# Patient Record
Sex: Female | Born: 1935 | Race: Black or African American | Hispanic: No | Marital: Single | Smoking: Never smoker
Health system: Southern US, Community
[De-identification: ages and names within clinical notes are randomized; demographics above are authoritative.]

## PROBLEM LIST (undated history)

## (undated) DIAGNOSIS — I509 Heart failure, unspecified: Secondary | ICD-10-CM

## (undated) DIAGNOSIS — M1990 Unspecified osteoarthritis, unspecified site: Secondary | ICD-10-CM

---

## 2020-08-28 DIAGNOSIS — S0083XA Contusion of other part of head, initial encounter: Secondary | ICD-10-CM | POA: Diagnosis not present

## 2020-08-28 DIAGNOSIS — S0012XA Contusion of left eyelid and periocular area, initial encounter: Secondary | ICD-10-CM | POA: Insufficient documentation

## 2020-08-28 DIAGNOSIS — M542 Cervicalgia: Secondary | ICD-10-CM | POA: Insufficient documentation

## 2020-08-28 DIAGNOSIS — S0990XA Unspecified injury of head, initial encounter: Secondary | ICD-10-CM | POA: Diagnosis present

## 2020-08-28 DIAGNOSIS — I509 Heart failure, unspecified: Secondary | ICD-10-CM | POA: Insufficient documentation

## 2020-08-28 DIAGNOSIS — M79662 Pain in left lower leg: Secondary | ICD-10-CM | POA: Diagnosis not present

## 2020-08-28 DIAGNOSIS — R079 Chest pain, unspecified: Secondary | ICD-10-CM | POA: Insufficient documentation

## 2020-08-28 DIAGNOSIS — W101XXA Fall (on)(from) sidewalk curb, initial encounter: Secondary | ICD-10-CM | POA: Diagnosis not present

## 2020-08-28 DIAGNOSIS — Z20822 Contact with and (suspected) exposure to covid-19: Secondary | ICD-10-CM | POA: Diagnosis not present

## 2020-08-28 DIAGNOSIS — M25512 Pain in left shoulder: Secondary | ICD-10-CM | POA: Insufficient documentation

## 2020-08-28 DIAGNOSIS — M7918 Myalgia, other site: Secondary | ICD-10-CM

## 2020-08-28 DIAGNOSIS — W19XXXA Unspecified fall, initial encounter: Secondary | ICD-10-CM

## 2020-08-28 HISTORY — DX: Heart failure, unspecified: I50.9

## 2020-08-28 HISTORY — DX: Unspecified osteoarthritis, unspecified site: M19.90

## 2020-08-28 NOTE — ED Provider Notes (Incomplete)
I provided a substantive portion of the care of this patient.  I personally performed the entirety of the history, exam and medical decision making for this encounter. {Remember to document shared critical care using "edcritical" dot phrase:1}     

## 2021-10-14 IMAGING — CT CT HEAD W/O CM
3 of 4 series · 13 of 47 positions shown, 15 images · non-contrast
Comparison: No pertinent prior exams available for comparison.

CLINICAL DATA: Head trauma, minor. Facial trauma. Neck trauma.
Additional history provided: Fall, on blood thinners.

EXAM:
CT HEAD WITHOUT CONTRAST
CT MAXILLOFACIAL WITHOUT CONTRAST
CT CERVICAL SPINE WITHOUT CONTRAST
TECHNIQUE: Multidetector CT imaging of the head, cervical spine, and
maxillofacial structures were performed using the standard protocol
without intravenous contrast. Multiplanar CT image reconstructions
of the cervical spine and maxillofacial structures were also
generated.

[Series 1: head wo · axial · 0.44mm/px · z∈[+1311,+1431]mm · 7 of 32 slices shown, 9 images]
[im 4/32  brain]
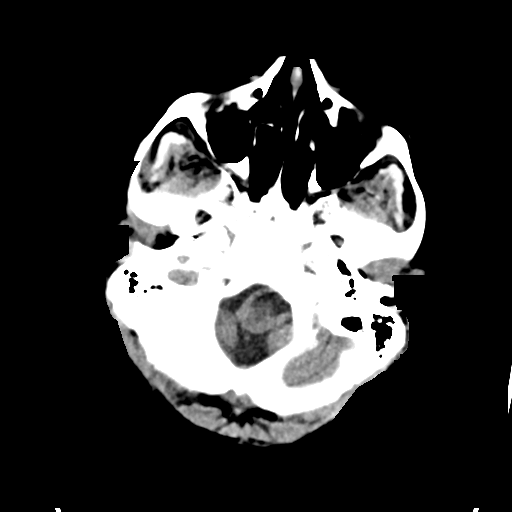
[im 4/32  bone]
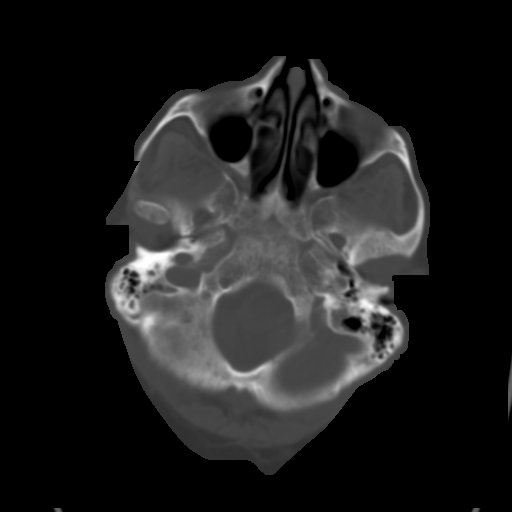
[im 8/32  brain]
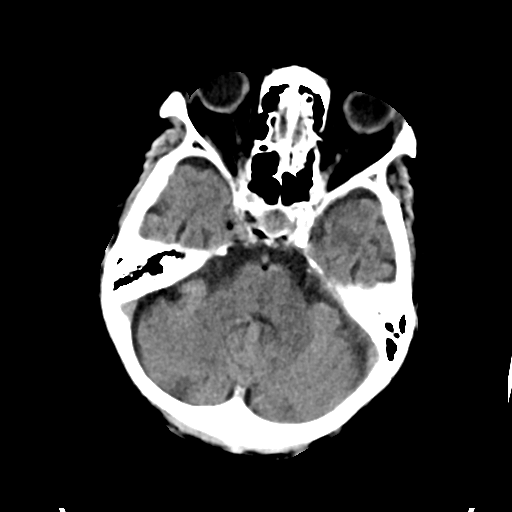
[im 12/32  brain]
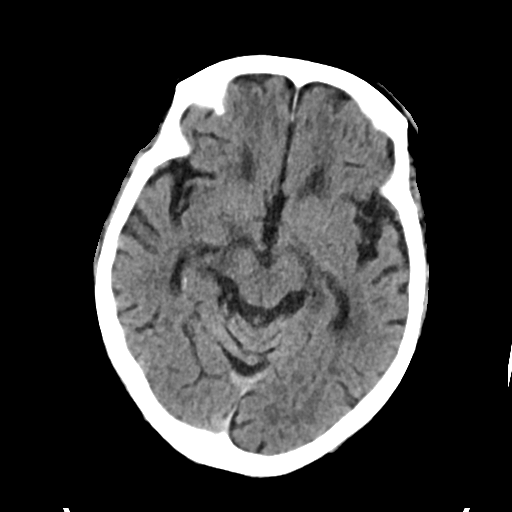
[im 16/32  brain]
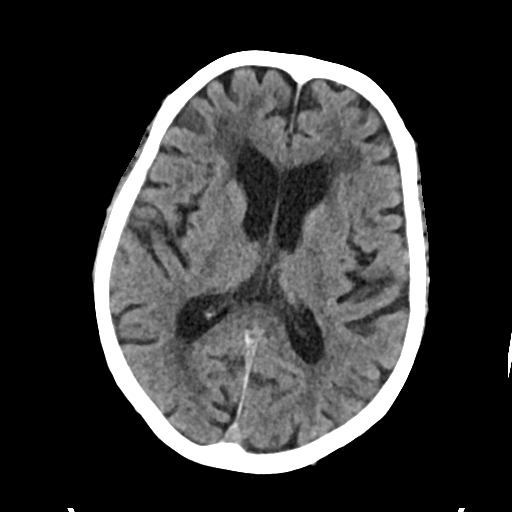
[im 20/32  brain]
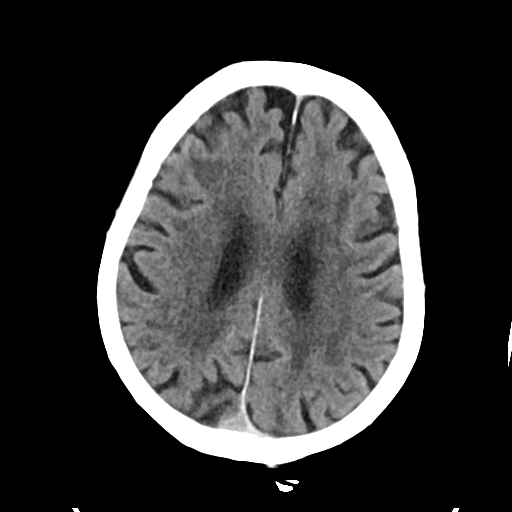
[im 20/32  bone]
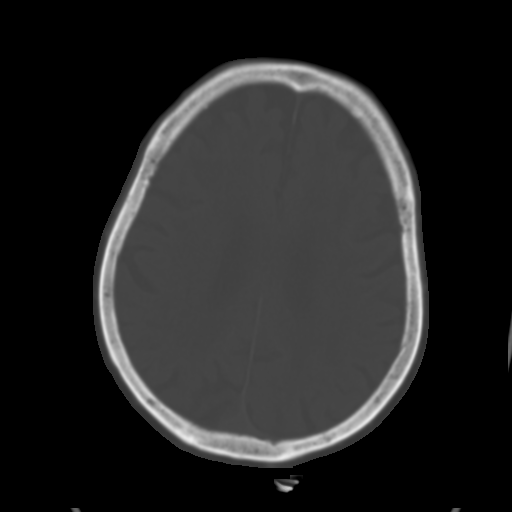
[im 24/32  brain]
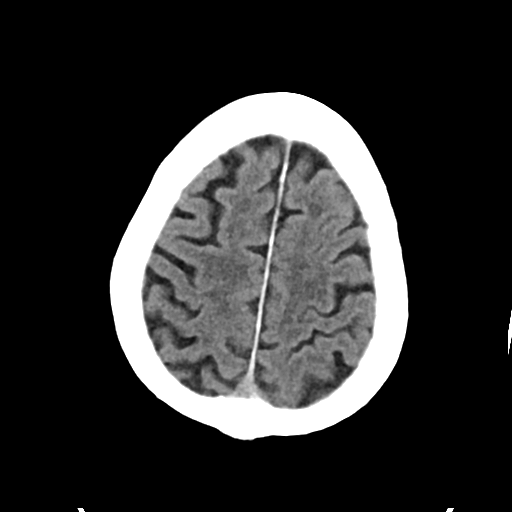
[im 28/32  brain]
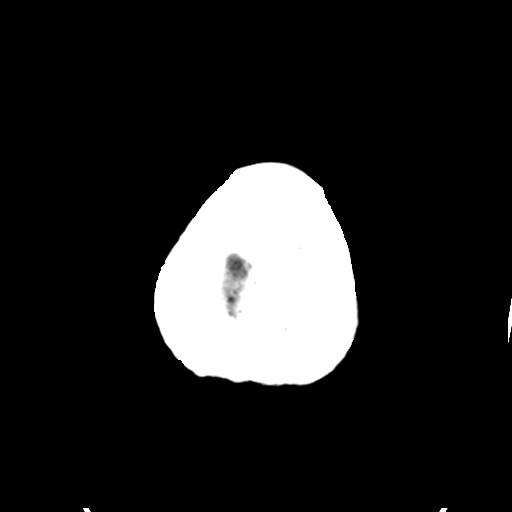

[Series 5: cor soft · coronal · 0.31mm/px · 3 of 73 slices shown]
[im 25/73  brain]
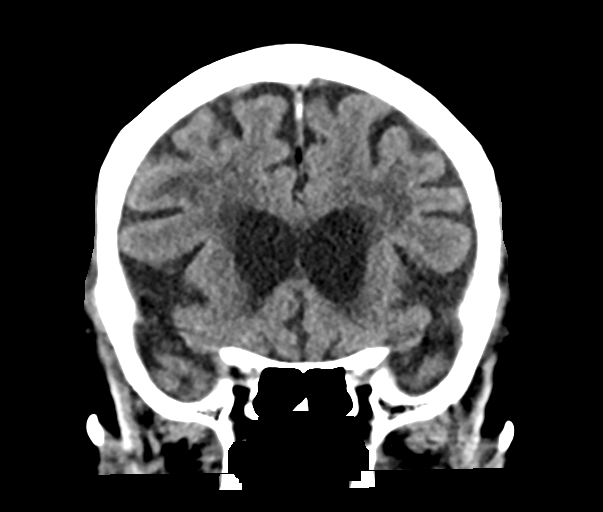
[im 33/73  brain]
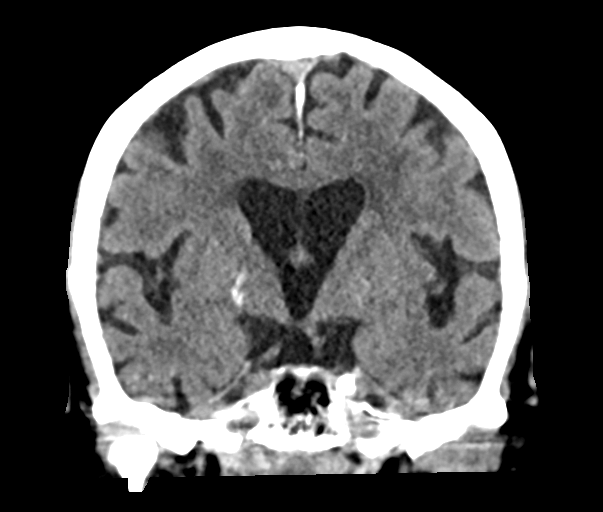
[im 41/73  brain]
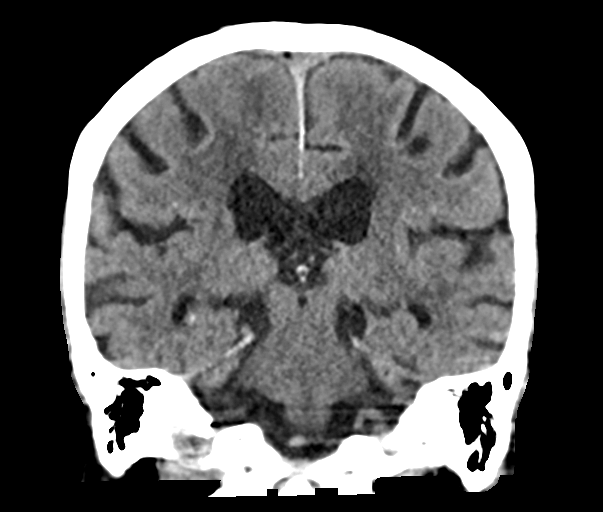

[Series 6: sag soft · sagittal · 0.31mm/px · 3 of 62 slices shown]
[im 21/62  brain]
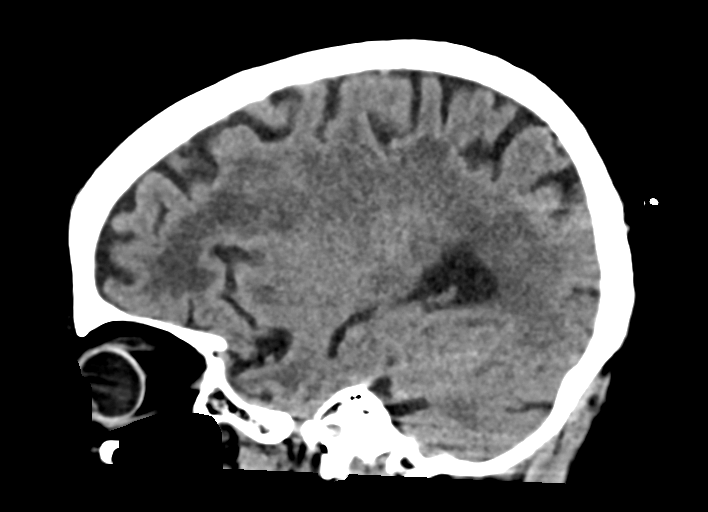
[im 31/62  brain]
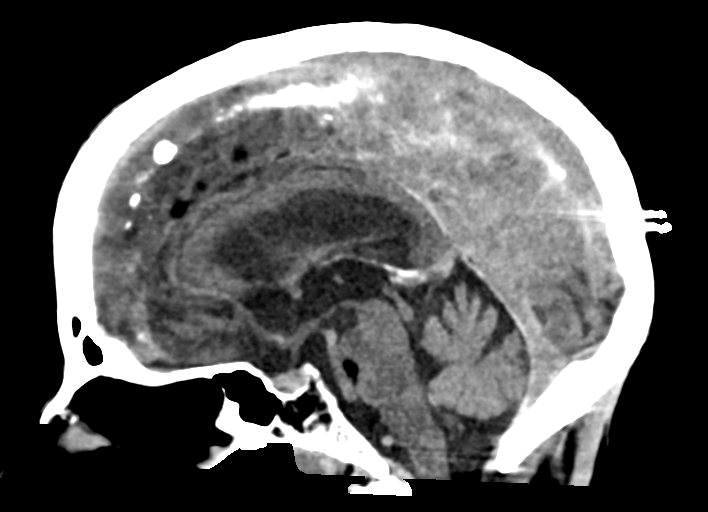
[im 41/62  brain]
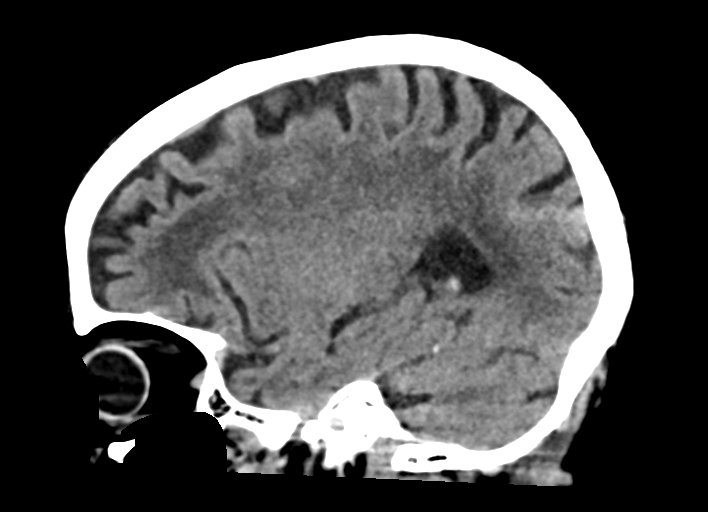

[13 of 47 positions shown; findings below may reference images not displayed]

FINDINGS: CT HEAD FINDINGS

Brain:

Moderate cerebral atrophy.

Advanced patchy and ill-defined hypoattenuation within the cerebral
white matter is nonspecific, but compatible with chronic small
vessel ischemic disease.

There is no acute intracranial hemorrhage.

No demarcated cortical infarct.

No extra-axial fluid collection.

No evidence of intracranial mass.

No midline shift.

Vascular: No hyperdense vessel.  Atherosclerotic calcifications.

Skull: Normal. Negative for fracture or focal lesion.

Other: No significant mastoid effusion. Incidentally noted round 11
mm low-density lesion within the midline posterior nasopharynx,
likely reflecting a Tornwaldt cyst (series 1, image 2).

CT MAXILLOFACIAL FINDINGS

Mildly motion degraded exam.

Osseous: No acute maxillofacial fracture is identified.

Orbits: No acute finding within the orbits. The globes are normal in
size and contour. The extraocular muscles and optic nerve sheath
complexes are symmetric and unremarkable. No evidence of retrobulbar
hematoma.

Sinuses: Trace bilateral ethmoid and maxillary sinus mucosal
thickening.

Soft tissues: Left forehead/periorbital hematoma.

Other: Anterior subluxation of the mandibular condyles bilaterally.

CT CERVICAL SPINE FINDINGS

Alignment: Straightening of the expected cervical lordosis. No
significant spondylolisthesis.

Skull base and vertebrae: The basion-dental and atlanto-dental
intervals are maintained.No evidence of acute fracture to the
cervical spine.

Soft tissues and spinal canal: No prevertebral fluid or swelling. No
visible canal hematoma.

Disc levels: Cervical spondylosis with multilevel disc space
narrowing, disc bulges, central disc protrusions, endplate spurring,
uncovertebral hypertrophy and facet arthrosis. Disc space narrowing
is greatest at C3-C4 (moderate/severe). Moderate disc degeneration
at the remaining levels. Ligamentum flavum calcification at C4-C5
and C5-C6. Multilevel spinal canal stenosis. Most notably, a central
disc protrusion contributes to multifactorial suspected at least
moderate spinal canal stenosis. Multilevel bony neural foraminal
narrowing.

Upper chest: No consolidation within the imaged lung apices. No
visible pneumothorax.

Other: Surgical clips within the right neck. Calcified
atherosclerotic plaque within the visualized proximal major branch
vessels of the neck and within the carotid arteries.
IMPRESSION: CT head:

1. No evidence of acute intracranial abnormality.
2. Moderate cerebral atrophy and advanced chronic small vessel
ischemic disease.

CT maxillofacial:

1. Mildly motion degraded exam.
2. No evidence of acute maxillofacial fracture.
3. Anterior subluxation of the mandibular condyles bilaterally. This
may be related to patient positioning at the time of image
acquisition. However, correlate with physical exam to exclude TMJ
dislocation.
4. Left forehead/periorbital hematoma.

CT cervical spine:

1. No evidence of acute fracture to the cervical spine.
2. Nonspecific straightening of the expected cervical lordosis.
3. Cervical spondylosis with multilevel spinal canal and neural
foraminal narrowing. Notably, a disc protrusion contributes to
suspected at least moderate spinal canal stenosis at C3-C4.

## 2021-10-14 IMAGING — DX DG CHEST 1V PORT
1 series · 1 of 1 positions shown · non-contrast
Comparison: None.

CLINICAL DATA: Left chest tenderness after a fall.

EXAM:
PORTABLE CHEST 1 VIEW

[chest ap]
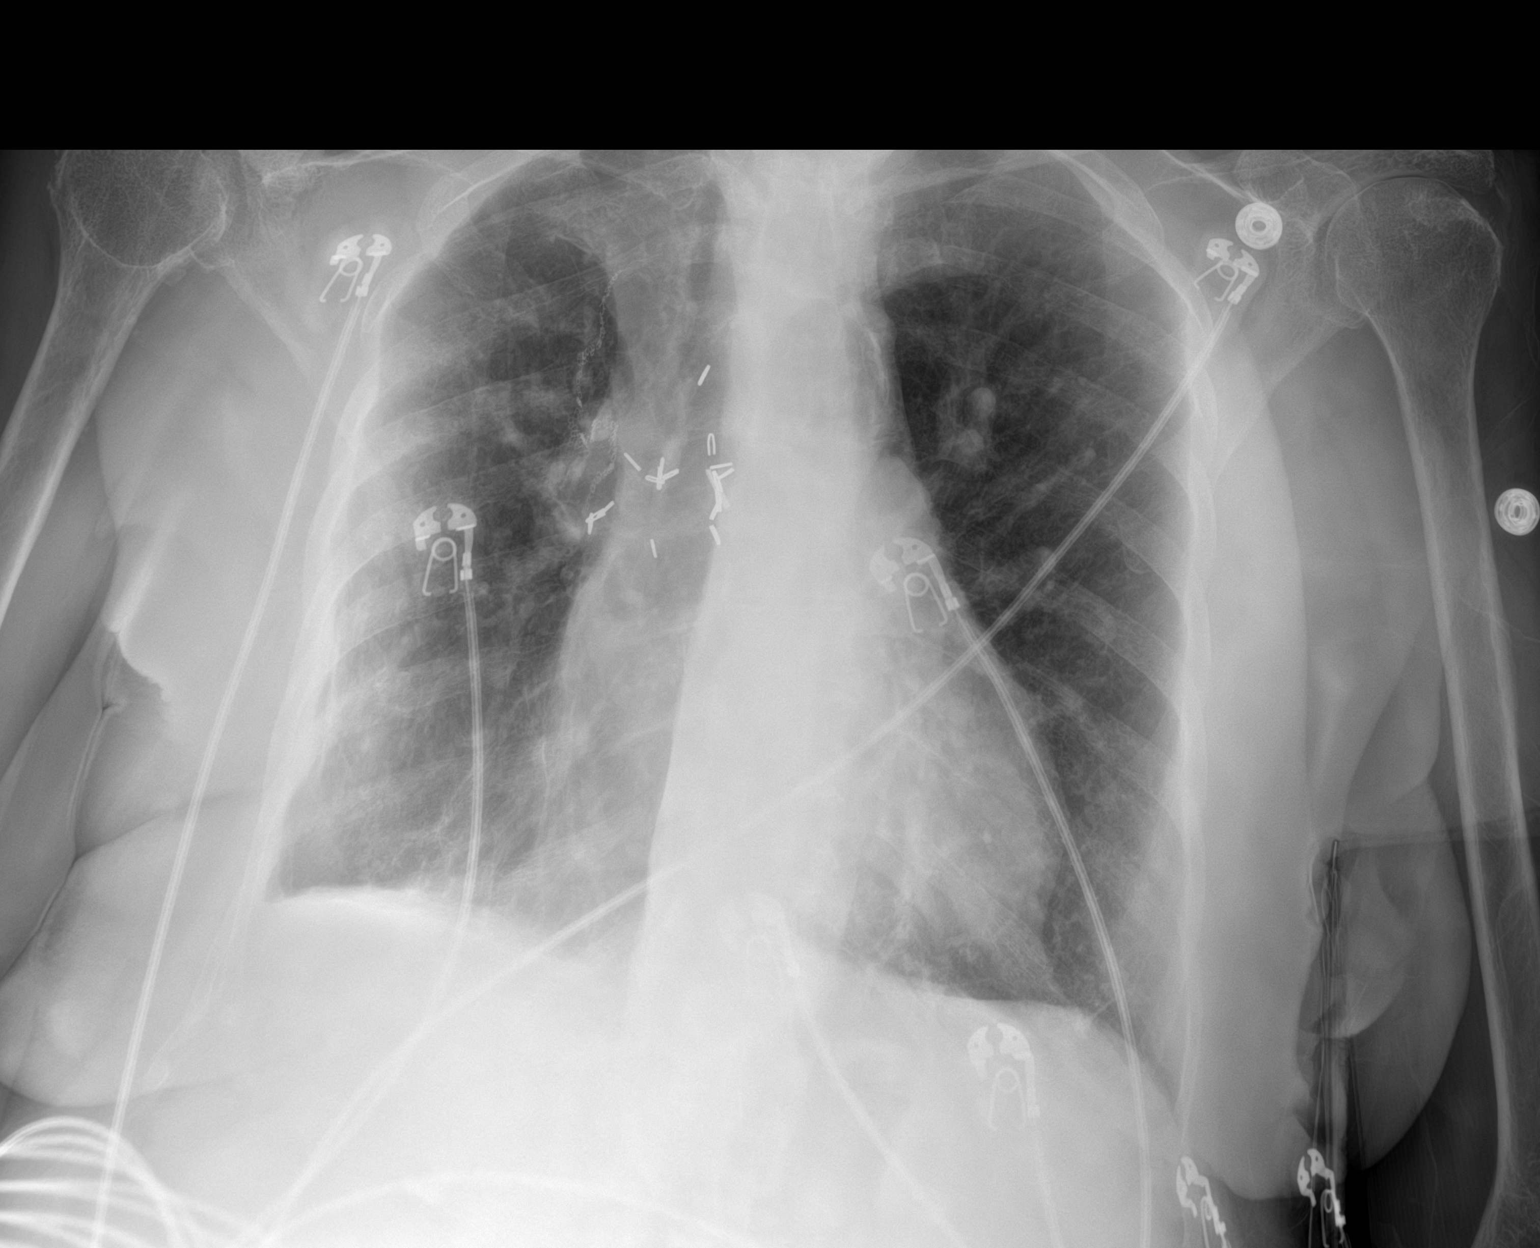

[1 of 1 positions shown; findings below may reference images not displayed]

FINDINGS: The lungs appear emphysematous. There is postoperative change in the
right chest with volume loss and pleural and parenchymal scarring.
No consolidative process or pneumothorax. Mild blunting at the right
costophrenic angle could be due to a tiny effusion but is likely
secondary to scar. Heart size is upper normal. Aortic
atherosclerosis. No acute or focal bony abnormality is seen.
IMPRESSION: No acute disease.

Postoperative change right chest.

Aortic Atherosclerosis ([id number]).
